# Patient Record
Sex: Female | Born: 1950 | Race: White | Hispanic: No | Marital: Married | Smoking: Current every day smoker
Health system: Southern US, Community
[De-identification: ages and names within clinical notes are randomized; demographics above are authoritative.]

## PROBLEM LIST (undated history)

## (undated) DIAGNOSIS — E785 Hyperlipidemia, unspecified: Secondary | ICD-10-CM

## (undated) DIAGNOSIS — E079 Disorder of thyroid, unspecified: Secondary | ICD-10-CM

## (undated) DIAGNOSIS — I10 Essential (primary) hypertension: Secondary | ICD-10-CM

## (undated) HISTORY — PX: CHOLECYSTECTOMY: SHX55

## (undated) HISTORY — PX: EYE SURGERY: SHX253

---

## 2008-09-22 ENCOUNTER — Ambulatory Visit: Payer: Self-pay | Admitting: Family Medicine

## 2008-09-22 DIAGNOSIS — J0100 Acute maxillary sinusitis, unspecified: Secondary | ICD-10-CM | POA: Insufficient documentation

## 2009-09-30 ENCOUNTER — Ambulatory Visit: Payer: Self-pay | Admitting: Family Medicine

## 2009-09-30 DIAGNOSIS — J40 Bronchitis, not specified as acute or chronic: Secondary | ICD-10-CM | POA: Insufficient documentation

## 2010-01-11 ENCOUNTER — Ambulatory Visit: Payer: Self-pay | Admitting: Family Medicine

## 2010-01-11 DIAGNOSIS — H9209 Otalgia, unspecified ear: Secondary | ICD-10-CM | POA: Insufficient documentation

## 2010-09-10 NOTE — Assessment & Plan Note (Signed)
Summary: R ear pain x this am rm 3   Vital Signs:  Patient Profile:   60 Years Old Female CC:      Cold & URI symptoms Height:     60.5 inches Weight:      122 pounds O2 Sat:      99 % O2 treatment:    Room Air Temp:     98.5 degrees F oral Pulse rate:   92 / minute Pulse rhythm:   regular Resp:     16 per minute BP sitting:   134 / 86  (right arm) Cuff size:   regular  Vitals Entered By: Areta Haber CMA (January 11, 2010 6:43 PM)                  Current Allergies: ! ERYTHROMYCIN ! AUGMENTIN ! * RADIOACTIVE IODINEHistory of Present Illness Chief Complaint: Cold & URI symptoms History of Present Illness: Subjective:  Patient complains of right ear itching, without pain.  She has also had sinus congestion and right facial pressure for several weeks.  No sore throat or fever.  No cough  Current Problems: EAR PAIN, RIGHT (ICD-388.70) ACUTE MAXILLARY SINUSITIS (ICD-461.0) BRONCHITIS NOT SPECIFIED AS ACUTE OR CHRONIC (ICD-490) ACUTE MAXILLARY SINUSITIS (ICD-461.0) ACUTE MAXILLARY SINUSITIS (ICD-461.0)   Current Meds * VITAMIN D 16109 units per week * VITAMIN D 2000 units per day * CENTRUM once a day * VITAMIN C once a day XANAX 0.5 MG TABS (ALPRAZOLAM) prn CLARITIN-D 12 HOUR 5-120 MG XR12H-TAB (LORATADINE-PSEUDOEPHEDRINE) prn * EPIPEN BEE STING KIT prn SYNTHROID 112 MCG TABS (LEVOTHYROXINE SODIUM) once a day PRILOSEC 20 MG CPDR (OMEPRAZOLE) once a day ZOCOR 20 MG TABS (SIMVASTATIN) once a day * ZIAC once a day TYLENOL 325 MG TABS (ACETAMINOPHEN) As directed CLARITIN 10 MG TABS (LORATADINE) As directed ROBITUSSIN MAXIMUM STRENGTH 15 MG/5ML SYRP (DEXTROMETHORPHAN HBR) AS directed AMOXICILLIN 875 MG TABS (AMOXICILLIN) One by mouth two times a day HYDROCORTISONE-ACETIC ACID 1-2 % SOLN (HYDROCORTISONE-ACETIC ACID) Place 3 to 5 gtts in affected ear q4 to 8 hours for 5 to 7 days  REVIEW OF SYSTEMS Constitutional Symptoms       Complains of fever and night sweats.      Denies chills, weight loss, weight gain, and fatigue.  Eyes       Complains of eye pain and eye drainage.      Denies change in vision, glasses, contact lenses, and eye surgery. Ear/Nose/Throat/Mouth       Complains of ear pain and sinus problems.      Denies hearing loss/aids, change in hearing, ear discharge, dizziness, frequent runny nose, frequent nose bleeds, sore throat, hoarseness, and tooth pain or bleeding.      Comments: R x this am Respiratory       Complains of dry cough.      Denies productive cough, wheezing, shortness of breath, asthma, bronchitis, and emphysema/COPD.  Cardiovascular       Denies murmurs, chest pain, and tires easily with exhertion.    Gastrointestinal       Denies stomach pain, nausea/vomiting, diarrhea, constipation, blood in bowel movements, and indigestion. Genitourniary       Denies painful urination, kidney stones, and loss of urinary control. Neurological       Denies paralysis, seizures, and fainting/blackouts. Musculoskeletal       Denies muscle pain, joint pain, joint stiffness, decreased range of motion, redness, swelling, muscle weakness, and gout.  Skin       Denies  bruising, unusual mles/lumps or sores, and hair/skin or nail changes.  Psych       Denies mood changes, temper/anger issues, anxiety/stress, speech problems, depression, and sleep problems. Other Comments: dry   Past History:  Past Medical History: Last updated: Sep 24, 2008 HTN cholesterol problems acquired hypothyroidism litchens disease  Past Surgical History: Last updated: 09/24/2008 gallbladder surgery 2001 tubal ligation  Family History: Last updated: 09-24-2008 mother deceased age 42 with renal failure father deceased 49 lung cancer sister deceased 22 from brain tumor 4 brothers and 4 sisters living alive - one diabetic and one had open heart surgery  Social History: Last updated: 24-Sep-2008 smoke 1 pack per day for 40 years denies alcohol and recreational  drug use   Objective:  no acute distress  Eyes:  Pupils are equal, round, and reactive to light and accomdation.  Extraocular movement is intact.  Conjunctivae are not inflamed.  Ears:  Right canal slightly erythematus but not swollen and no exudate, and no tenderness to speculum insertion.  Tympanic membranes normal.   No TMJ tenderness Nose:  Normal septum.  Normal turbinates, mildly congested.  Right maxillary tenderness present.  Pharynx:  Normal  Neck:  Supple.  No adenopathy is present.  No thyromegaly is present  Lungs:  Clear to auscultation.  Breath sounds are equal.  Heart:  Regular rate and rhythm without murmurs, rubs, or gallops.  Abdomen:  Nontender without masses or hepatosplenomegaly.  Bowel sounds are present.  No CVA or flank tenderness.  Assessment New Problems: EAR PAIN, RIGHT (ICD-388.70) ACUTE MAXILLARY SINUSITIS (ICD-461.0)   Plan New Medications/Changes: HYDROCORTISONE-ACETIC ACID 1-2 % SOLN (HYDROCORTISONE-ACETIC ACID) Place 3 to 5 gtts in affected ear q4 to 8 hours for 5 to 7 days  #10cc x 0, 01/11/2010, Donna Christen MD AMOXICILLIN 875 MG TABS (AMOXICILLIN) One by mouth two times a day  #20 x 0, 01/11/2010, Donna Christen MD  New Orders: Est. Patient Level III [phone] Planning Comments:   Begin anti-inflammatory ear drops for several days.  Begin amoxicillin for sinusitis.  Mucinex with increased fluids.  Saline nasal spray.  Use Afrin for about 5 days.  Stop antihistamine for several days. Follow-up with PCP if not improving.   The patient and/or caregiver has been counseled thoroughly with regard to medications prescribed including dosage, schedule, interactions, rationale for use, and possible side effects and they verbalize understanding.  Diagnoses and expected course of recovery discussed and will return if not improved as expected or if the condition worsens. Patient and/or caregiver verbalized understanding.  Prescriptions: HYDROCORTISONE-ACETIC ACID  1-2 % SOLN (HYDROCORTISONE-ACETIC ACID) Place 3 to 5 gtts in affected ear q4 to 8 hours for 5 to 7 days  #10cc x 0   Entered and Authorized by:   Donna Christen MD   Signed by:   Donna Christen MD on 01/11/2010   Method used:   Print then Give to Patient   RxID:   [id number] AMOXICILLIN 875 MG TABS (AMOXICILLIN) One by mouth two times a day  #20 x 0   Entered and Authorized by:   Donna Christen MD   Signed by:   Donna Christen MD on 01/11/2010   Method used:   Print then Give to Patient   RxID:   [id number]   Orders Added: 1)  Est. Patient Level III [41324]

## 2010-09-10 NOTE — Assessment & Plan Note (Signed)
Summary: Cough-yellowis, r ear pain - x 1 wk rm 3   Vital Signs:  Patient Profile:   60 Years Old Female CC:      Cold & URI symptoms Height:     60.5 inches Weight:      124 pounds O2 Sat:      99 % O2 treatment:    Room Air Temp:     98.7 degrees F oral Pulse rate:   95 / minute Pulse rhythm:   regular Resp:     16 per minute BP sitting:   122 / 72  (right arm) Cuff size:   regular  Vitals Entered By: Areta Haber CMA (September 30, 2009 4:58 PM)                  Current Allergies (reviewed today): ! ERYTHROMYCIN ! 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 Comes c/o cold like sympotms for over 1 week.  has had low grade fever (subjective), persitent coughing with yellow sputum as wel as nasal congestion that worsening, now also with sinus pressure.h/o sinus infection in the past. Using a decongestant and musinex. Denies chest pain or shortness of breath. Taking musinex and claritin uses advair "very seldom". Did not get flu vaccine. No abdominal pain nausea vomitting or diarrhea.  Current Problems: BRONCHITIS NOT SPECIFIED AS ACUTE OR CHRONIC (ICD-490) ACUTE MAXILLARY SINUSITIS (ICD-461.0) ACUTE MAXILLARY SINUSITIS (ICD-461.0)   Current Meds * VITAMIN D 29528 units per week * VITAMIN D 2000 units per day * CENTRUM once a day * VITAMIN C once a day XANAX 0.5 MG TABS (ALPRAZOLAM) prn CLARITIN-D 12 HOUR 5-120 MG XR12H-TAB (LORATADINE-PSEUDOEPHEDRINE) prn * EPIPEN BEE STING KIT prn SYNTHROID 112 MCG TABS (LEVOTHYROXINE SODIUM) once a day PRILOSEC 20 MG CPDR (OMEPRAZOLE) once a day ZOCOR 20 MG TABS (SIMVASTATIN) once a day * ZIAC once a day TYLENOL 325 MG TABS (ACETAMINOPHEN) As directed CLARITIN 10 MG TABS (LORATADINE) As directed ROBITUSSIN MAXIMUM STRENGTH 15 MG/5ML SYRP (DEXTROMETHORPHAN HBR) AS directed DOXYCYCLINE HYCLATE 100 MG CAPS (DOXYCYCLINE HYCLATE)  1 tab by mouth two times a day for 10 days PREDNISONE 20 MG TABS (PREDNISONE) 2 tabs by mouth daily for 5 days  REVIEW OF SYSTEMS Constitutional Symptoms       Complains of fever.     Denies chills, night sweats, weight loss, weight gain, and fatigue.  Eyes       Denies change in vision, eye pain, eye discharge, glasses, contact lenses, and eye surgery. Ear/Nose/Throat/Mouth       Complains of ear pain and hoarseness.      Denies hearing loss/aids, change in hearing, ear discharge, dizziness, frequent runny nose, frequent nose bleeds, sinus problems, sore throat, and tooth pain or bleeding.      Comments: Right x 1 week Respiratory       Complains of productive cough.      Denies dry cough, wheezing, shortness of breath, asthma, bronchitis, and emphysema/COPD.  Cardiovascular       Denies murmurs, chest pain, and tires easily with exhertion.    Gastrointestinal       Denies stomach pain, nausea/vomiting, diarrhea, constipation, blood in bowel movements, and indigestion. Genitourniary       Denies painful urination, kidney stones, and loss of urinary control. Neurological       Denies paralysis, seizures, and fainting/blackouts. Musculoskeletal       Denies  muscle pain, joint pain, joint stiffness, decreased range of motion, redness, swelling, muscle weakness, and gout.  Skin       Denies bruising, unusual mles/lumps or sores, and hair/skin or nail changes.  Psych       Denies mood changes, temper/anger issues, anxiety/stress, speech problems, depression, and sleep problems. Other Comments: Pt has not seen PCP for this.   Past History:  Past Medical History: Last updated: 2008-10-05 HTN cholesterol problems acquired hypothyroidism litchens disease  Past Surgical History: Last updated: Oct 05, 2008 gallbladder surgery 2001 tubal ligation  Family History: Last updated: 10/05/2008 mother deceased age 63 with renal failure father deceased 71 lung cancer sister deceased 30  from brain tumor 4 brothers and 4 sisters living alive - one diabetic and one had open heart surgery  Social History: Last updated: 10/05/2008 smoke 1 pack per day for 40 years denies alcohol and recreational drug use Physical Exam General appearance: well developed, well nourished, no acute distress Eyes: conjunctivae and lids normal Pupils: equal, round, reactive to light Ears: Normal TM's Nasal: Nasal congestion. Red swolen turbinates.  Oral/Pharynx: tongue normal, posterior pharynx without erythema or exudate Neck: neck supple,  trachea midline, no masses Chest/Lungs: no rales, wheezes, or rhonchi bilateral, breath sounds equal without effort Heart: regular rate and  rhythm, no murmur Extremities: No LEE Assessment New Problems: BRONCHITIS NOT SPECIFIED AS ACUTE OR CHRONIC (ICD-490) ACUTE MAXILLARY SINUSITIS (ICD-461.0)  Advised to quit smoking. Treated with prednisone 40mg  daily for 5 days. Doxycycline 100mg  two times a day for 10 days. Symptomatic treatemtn discussed. Follow with primay doctor if persistent, recurrent or worsening symptoms.  Plan New Medications/Changes: PREDNISONE 20 MG TABS (PREDNISONE) 2 tabs by mouth daily for 5 days  #10 x 0, 09/30/2009, Clearance Chenault Moreno-Coll  MD DOXYCYCLINE HYCLATE 100 MG CAPS (DOXYCYCLINE HYCLATE) 1 tab by mouth two times a day for 10 days  #20 x 0, 09/30/2009, Kendalynn Wideman Moreno-Coll  MD  New Orders: Est. Patient Level III [16109]  The patient and/or caregiver has been counseled thoroughly with regard to medications prescribed including dosage, schedule, interactions, rationale for use, and possible side effects and they verbalize understanding.  Diagnoses and expected course of recovery discussed and will return if not improved as expected or if the condition worsens. Patient and/or caregiver verbalized understanding.  Prescriptions: PREDNISONE 20 MG TABS (PREDNISONE) 2 tabs by mouth daily for 5 days  #10 x 0   Entered and Authorized by:    Sharin Grave  MD   Signed by:   Sharin Grave  MD on 09/30/2009   Method used:   Print then Give to Patient   RxID:   [id number] DOXYCYCLINE HYCLATE 100 MG CAPS (DOXYCYCLINE HYCLATE) 1 tab by mouth two times a day for 10 days  #20 x 0   Entered and Authorized by:   Sharin Grave  MD   Signed by:   Sharin Grave  MD on 09/30/2009   Method used:   Print then Give to Patient   RxID:   [id number]   Patient Instructions: 1)  Take the preecribed antibioic as instructed. 2)  Use nasal saline spray at lest 3 times a aday. 3)  Conitnue to use tyleonol every 6 hours or motrin every 8 hours as needed for pain or fever. 4)  Follow up with your primary if no improvement in 3-5 days, sooner if increasing cough, fever, or new symptoms ( shortness of breath, chest pain) .

## 2016-02-11 ENCOUNTER — Emergency Department (INDEPENDENT_AMBULATORY_CARE_PROVIDER_SITE_OTHER)
Admission: EM | Admit: 2016-02-11 | Discharge: 2016-02-11 | Disposition: A | Discharge status: Home / Self Care | Payer: Medicare Other | Source: Home / Self Care | Attending: Family Medicine | Admitting: Family Medicine

## 2016-02-11 DIAGNOSIS — J320 Chronic maxillary sinusitis: Secondary | ICD-10-CM | POA: Diagnosis not present

## 2016-02-11 DIAGNOSIS — H6983 Other specified disorders of Eustachian tube, bilateral: Secondary | ICD-10-CM

## 2016-02-11 HISTORY — DX: Disorder of thyroid, unspecified: E07.9

## 2016-02-11 HISTORY — DX: Essential (primary) hypertension: I10

## 2016-02-11 HISTORY — DX: Hyperlipidemia, unspecified: E78.5

## 2016-02-11 MED ORDER — PREDNISONE 20 MG PO TABS: ORAL_TABLET | ORAL | Status: AC

## 2016-02-11 MED ORDER — AMOXICILLIN 875 MG PO TABS: 875.0000 mg | ORAL_TABLET | Freq: Two times a day (BID) | ORAL | Status: AC

## 2016-02-11 NOTE — ED Notes (Signed)
Pt stated that right ear pain started about 3 weeks ago, but hasn't been bad until last night.  Had to take tylenol last night to sleep.  Woke her up this am with pain again. Pt stated that she has had issues with her right sinus for about 4 years, and that it is blocked and not sure if that could be causing some of the pain.

## 2016-02-11 NOTE — Discharge Instructions (Signed)
May use Afrin nasal spray (or generic oxymetazoline) twice daily for about 5 days and then discontinue.  Also recommend using saline nasal spray several times daily and saline nasal irrigation (AYR is a common brand).   Stop all antihistamines for now, and other non-prescription cough/cold preparations.

## 2016-02-11 NOTE — ED Provider Notes (Signed)
CSN: [id number]     Arrival date & time 02/11/16  1612 History   First MD Initiated Contact with Patient 02/11/16 1735     Chief Complaint  Patient presents with  . Otalgia      HPI Comments: Patient states that she developed a sensation of something in her right ear about 3 weeks ago.  During the past 3 days she has developed pain in her right ear which awakened her at 6am today.  She has developed pain/pressure in her right face and has increased sinus congestion.  No fever. She has a past history of right maxillary sinus pain (had abnormal CT scan by ENT four years ago).  The history is provided by the patient.    Past Medical History  Diagnosis Date  . Hypertension   . Hyperlipemia   . Thyroid disease    Past Surgical History  Procedure Laterality Date  . Eye surgery    . Cholecystectomy     Family History  Problem Relation Age of Onset  . Diabetes Mother   . Cancer Father   . Cancer Sister   . Diabetes Brother    Social History  Substance Use Topics  . Smoking status: Current Every Day Smoker -- 1.00 packs/day    Types: Cigarettes  . Smokeless tobacco: Never Used  . Alcohol Use: No   OB History    No data available     Review of Systems No sore throat No cough No pleuritic pain No wheezing + nasal congestion + post-nasal drainage + sinus pain/pressure No itchy/red eyes + right earache No hemoptysis No SOB No fever/chills No nausea No vomiting No abdominal pain No diarrhea No urinary symptoms No skin rash + fatigue No myalgias No headache Used OTC meds without relief  Allergies  Amoxicillin-pot clavulanate and Erythromycin  Home Medications   Prior to Admission medications   Medication Sig Start Date End Date Taking? Authorizing Provider  aspirin 81 MG tablet Take 81 mg by mouth daily.   Yes Historical Provider, MD  bisoprolol-hydrochlorothiazide (ZIAC) 2.5-6.25 MG tablet Take 1 tablet by mouth daily.   Yes Historical Provider, MD   estrogens, conjugated, (PREMARIN) 0.3 MG tablet Take 0.3 mg by mouth daily. Unsure of the dosage.   Yes Historical Provider, MD  levothyroxine (SYNTHROID, LEVOTHROID) 100 MCG tablet Take 110 mcg by mouth daily before breakfast.   Yes Historical Provider, MD  loratadine-pseudoephedrine (CLARITIN-D 24-HOUR) 10-240 MG 24 hr tablet Take 1 tablet by mouth as needed for allergies.   Yes Historical Provider, MD  mometasone (ELOCON) 0.1 % ointment Apply topically as needed.   Yes Historical Provider, MD  progesterone (PROMETRIUM) 100 MG capsule Take 100 mg by mouth daily. Unsure of the dosage   Yes Historical Provider, MD  ranitidine (ZANTAC) 150 MG capsule Take 150 mg by mouth 2 (two) times daily.   Yes Historical Provider, MD  amoxicillin (AMOXIL) 875 MG tablet Take 1 tablet (875 mg total) by mouth 2 (two) times daily. 02/11/16   Lattie HawStephen A Beese, MD  predniSONE (DELTASONE) 20 MG tablet Take one tab by mouth twice daily for 5 days, then one daily. Take with food. 02/11/16   Lattie HawStephen A Beese, MD   Meds Ordered and Administered this Visit  Medications - No data to display  BP 129/73 mmHg  Pulse 83  Temp(Src) 98.5 F (36.9 C) (Oral)  Ht 5\' 2"  (1.575 m)  Wt 121 lb 8 oz (55.112 kg)  BMI 22.22 kg/m2  SpO2 96%  LMP  No data found.   Physical Exam Nursing notes and Vital Signs reviewed. Appearance:  Patient appears stated age, and in no acute distress Eyes:  Pupils are equal, round, and reactive to light and accomodation.  Extraocular movement is intact.  Conjunctivae are not inflamed  Ears:  Canals normal.  Tympanic membranes normal.  Nose:  Mildly congested turbinates.  Right maxillary sinus tenderness is present.  Pharynx:  Normal Neck:  Supple.  Tender enlarged posterior/lateral nodes are palpated bilaterally  Lungs:  Clear to auscultation.  Breath sounds are equal.  Moving air well. Heart:  Regular rate and rhythm without murmurs, rubs, or gallops.  Abdomen:  Nontender without masses or  hepatosplenomegaly.  Bowel sounds are present.  No CVA or flank tenderness.  Extremities:  No edema.  Skin:  No rash present.   ED Course  Procedures none     Labs Reviewed -  Tympanometry:  Left ear positive peak pressure; Right ear normal    MDM   1. Eustachian tube dysfunction, bilateral   2. Right maxillary sinusitis     Begin amoxicillin 875mg  BID (no adverse effects from amoxicillin), and prednisone burst. May use Afrin nasal spray (or generic oxymetazoline) twice daily for about 5 days and then discontinue.  Also recommend using saline nasal spray several times daily and saline nasal irrigation (AYR is a common brand).   Stop all antihistamines for now, and other non-prescription cough/cold preparations. Followup with ENT if not cleared in 10 days.     Lattie HawStephen A Beese, MD 02/11/16 Ebony [id number]

## 2018-03-16 ENCOUNTER — Other Ambulatory Visit: Payer: Self-pay | Admitting: Rehabilitation

## 2018-03-16 DIAGNOSIS — M542 Cervicalgia: Secondary | ICD-10-CM

## 2018-03-24 ENCOUNTER — Ambulatory Visit
Admission: RE | Admit: 2018-03-24 | Discharge: 2018-03-24 | Disposition: A | Discharge status: Home / Self Care | Payer: Medicare Other | Source: Ambulatory Visit | Attending: Rehabilitation | Admitting: Rehabilitation

## 2018-03-24 DIAGNOSIS — M542 Cervicalgia: Secondary | ICD-10-CM

## 2020-01-29 IMAGING — MR MR CERVICAL SPINE W/O CM
4 of 5 series · 29 of 48 positions shown · non-contrast
Comparison: None.

CLINICAL DATA: Neck pain with bilateral arm and hand pain over the
last 2 months.

EXAM:
MRI CERVICAL SPINE WITHOUT CONTRAST
TECHNIQUE: Multiplanar, multisequence MR imaging of the cervical spine was
performed. No intravenous contrast was administered.

[Series 4: T2 · sagittal · 3.0mm · 0.66mm/px · 8 of 16 slices shown (1 of 2)]
[im 1/16]
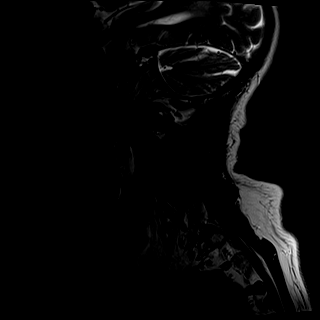
[im 3/16]
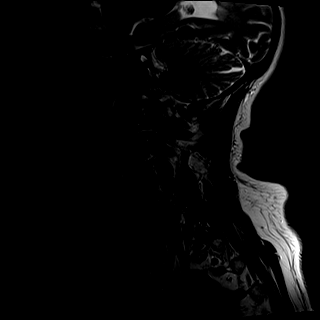
[im 5/16]
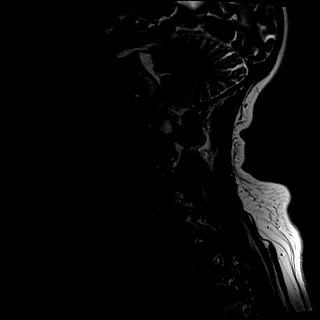
[im 7/16]
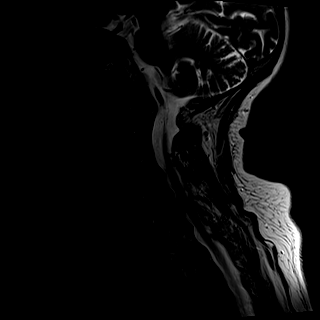
[im 9/16]
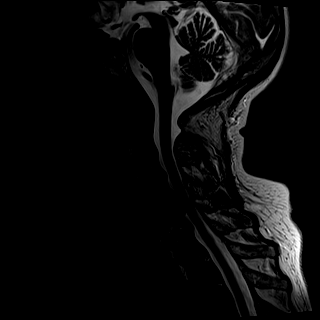
[im 11/16]
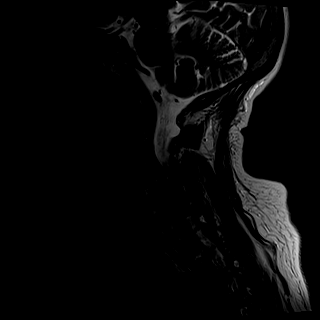
[im 13/16]
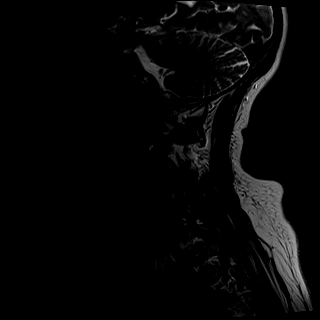
[im 16/16]
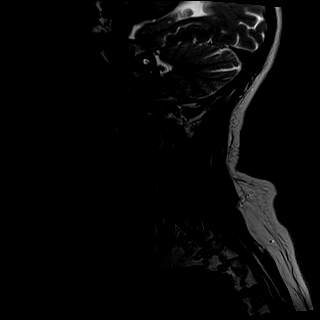

[Series 5: T1 · sagittal · 3.0mm · 0.41mm/px · 8 of 16 slices shown]
[im 1/16]
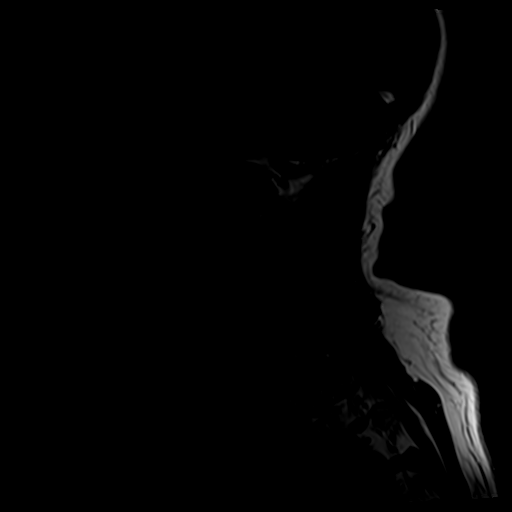
[im 3/16]
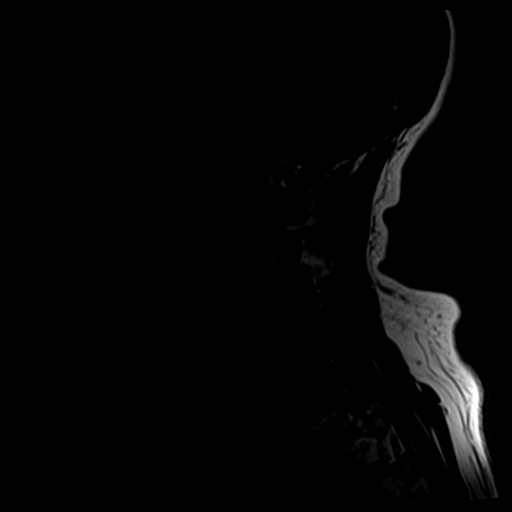
[im 5/16]
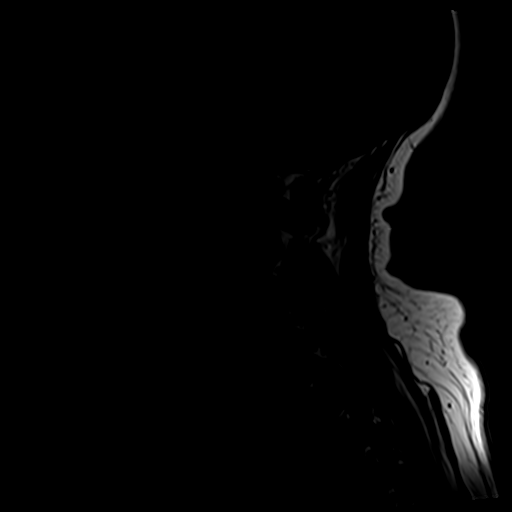
[im 7/16]
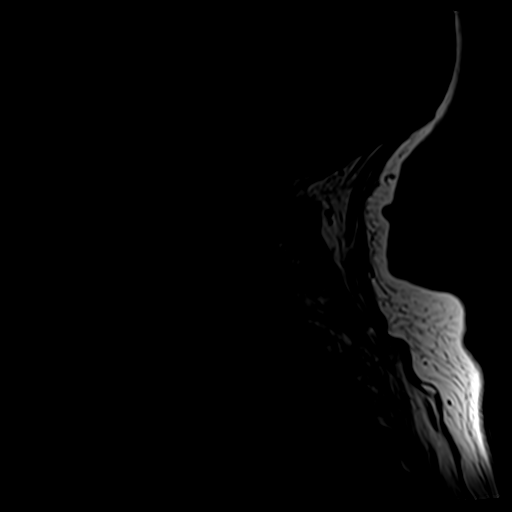
[im 9/16]
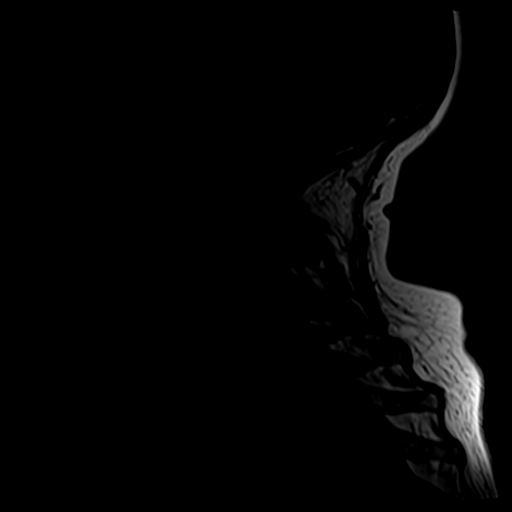
[im 11/16]
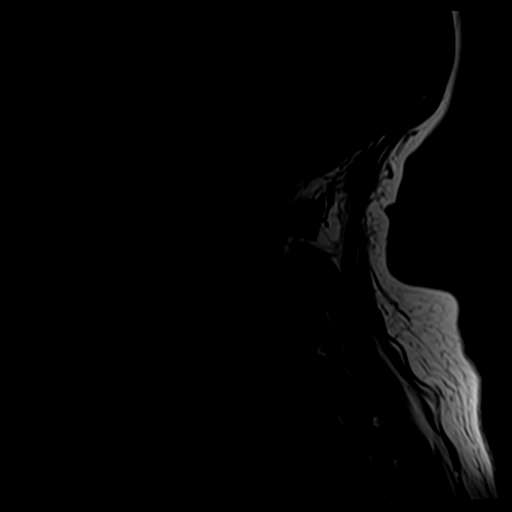
[im 13/16]
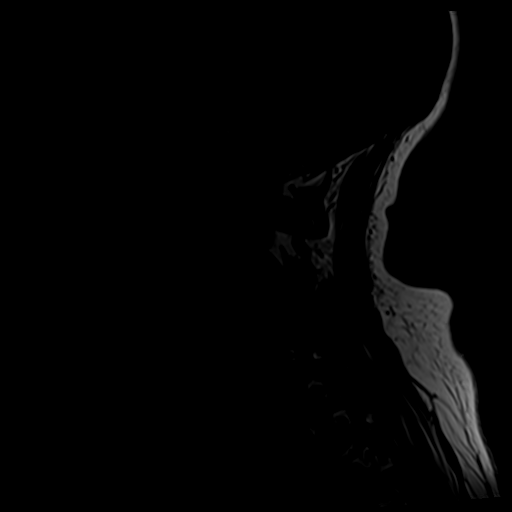
[im 16/16]
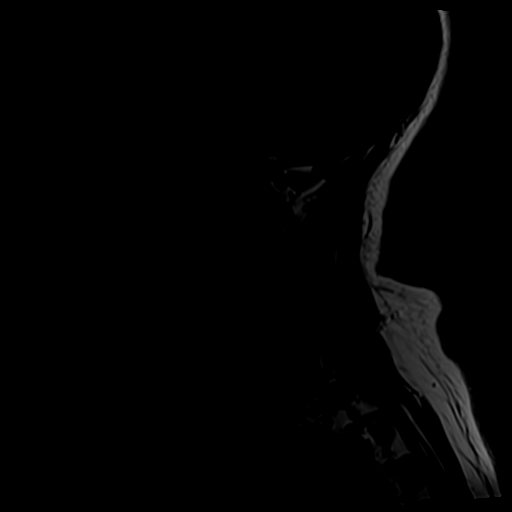

[Series 6: tir sag · sagittal · 3.0mm · 0.41mm/px · 4 of 16 slices shown]
[im 1/16]
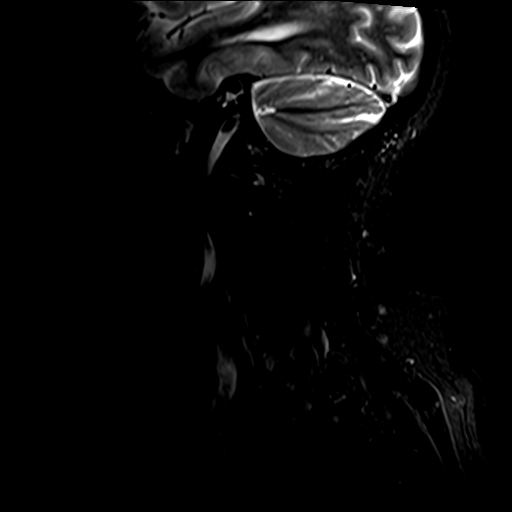
[im 3/16]
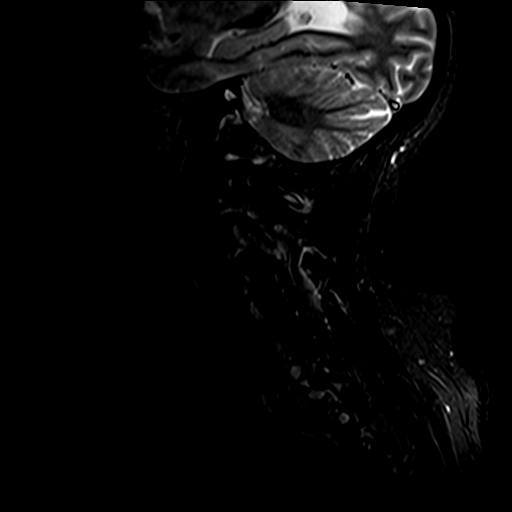
[im 9/16]
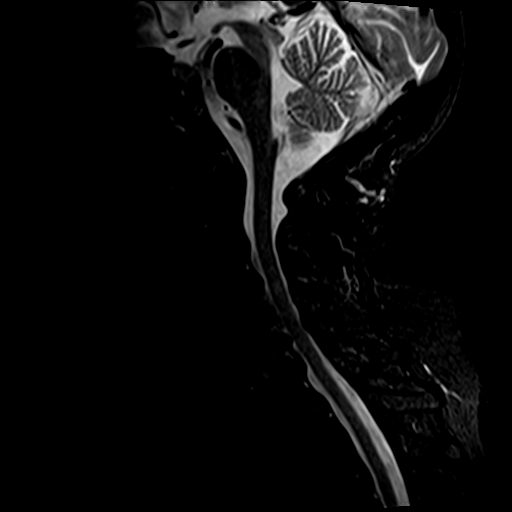
[im 13/16]
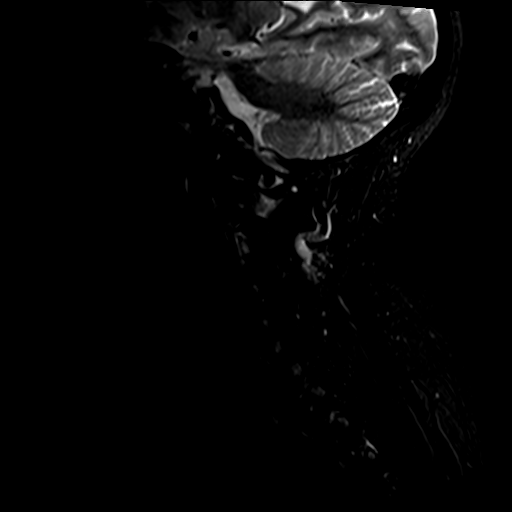

[Series 8: T2 · axial · 3.0mm · 0.70mm/px · z∈[-98,-12]mm · 9 of 25 slices shown (2 of 2)]
[im 1/25]
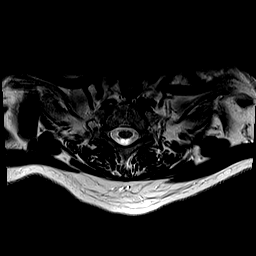
[im 5/25]
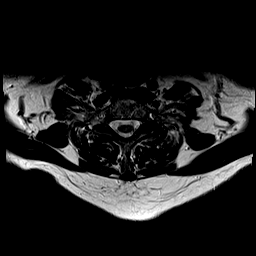
[im 7/25]
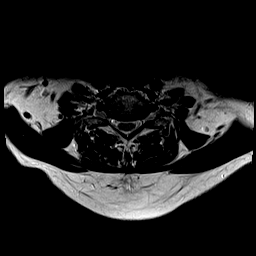
[im 11/25]
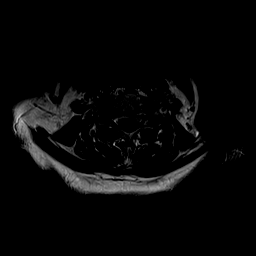
[im 14/25]
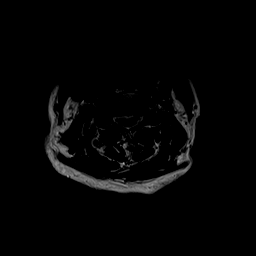
[im 18/25]
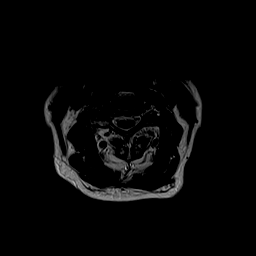
[im 20/25]
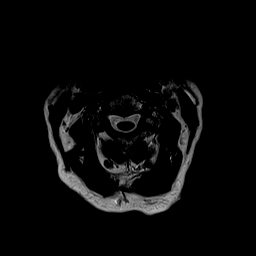
[im 22/25]
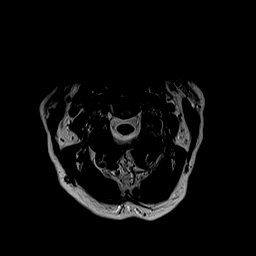
[im 25/25]
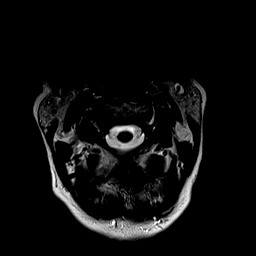

[29 of 48 positions shown; findings below may reference images not displayed]

FINDINGS: Alignment: Normal except for 2 mm of degenerative anterolisthesis at
C7-T1.

Vertebrae: No fracture or primary bone lesion. Discogenic endplate
marrow changes.

Cord: No cord compression or primary cord lesion.

Posterior Fossa, vertebral arteries, paraspinal tissues: Negative

Disc levels:

Foramen magnum is widely patent. There is ordinary osteoarthritis at
the C1-2 articulation but no encroachment upon the neural spaces.

C2-3: Central disc bulge without neural compression. Facet
osteoarthritis on the left with mild edema could contribute to neck
pain. No foraminal stenosis.

C3-4: Spondylosis with endplate osteophytes and bulging of the disc
more prominent towards the right. Right-sided facet osteoarthritis.
No central canal stenosis. Right foraminal narrowing could compress
the right C4 nerve.

C4-5: Spondylosis with endplate osteophytes and bulging of the disc.
Narrowing of the ventral subarachnoid space but no compression of
the cord. Mild facet degeneration and hypertrophy. Bilateral
foraminal narrowing because of osteophytic encroachment could affect
either C5 nerve.

C5-6: Spondylosis with endplate osteophytes and bulging of the disc.
Canal narrowing with AP diameter of 8.5 mm. Effacement of the
subarachnoid space but no compression of the cord. Bilateral
foraminal stenosis because of osteophyte in disc material could
compress either or both C6 nerves.

C6-7: Spondylosis with endplate osteophytes and bulging of the disc.
No compressive canal stenosis. Mild foraminal narrowing on the right
but without visible neural compression.

C7-T1: Bilateral facet arthropathy with 2 mm of anterolisthesis. No
disc bulge or herniation. No canal or foraminal stenosis.
IMPRESSION: C2-3: Left-sided facet arthropathy with edema could contribute to
neck pain. No compressive stenosis of the canal or foramina.

C3-4: Right-sided predominant spondylosis and facet arthropathy with
right foraminal narrowing that could compress the right C4 nerve.

C4-5, and C5-6: Degenerative spondylosis with bilateral foraminal
narrowing at each of these levels with potential to compress either
or both C5 and C6 nerves. Findings are more severe at C5-6 than
C4-5. Canal stenosis at C5-6 with effacement of the subarachnoid
space but no frank compression of the cord.

C6-7: Spondylosis with mild right foraminal narrowing, not grossly
compressive.

C7-T1: Facet arthropathy with 2 mm of anterolisthesis. No canal or
foraminal stenosis.
